# Patient Record
Sex: Female | Born: 1960 | Race: White | Hispanic: No | Marital: Married | Smoking: Never smoker
Health system: Southern US, Community
[De-identification: ages and names within clinical notes are randomized; demographics above are authoritative.]

## PROBLEM LIST (undated history)

## (undated) DIAGNOSIS — L309 Dermatitis, unspecified: Secondary | ICD-10-CM

## (undated) DIAGNOSIS — N840 Polyp of corpus uteri: Secondary | ICD-10-CM

## (undated) DIAGNOSIS — K219 Gastro-esophageal reflux disease without esophagitis: Secondary | ICD-10-CM

## (undated) DIAGNOSIS — N950 Postmenopausal bleeding: Secondary | ICD-10-CM

## (undated) HISTORY — PX: TONSILLECTOMY: SUR1361

---

## 1998-06-03 ENCOUNTER — Other Ambulatory Visit: Admission: RE | Admit: 1998-06-03 | Discharge: 1998-06-03 | Payer: Self-pay | Admitting: Obstetrics and Gynecology

## 1998-11-07 ENCOUNTER — Ambulatory Visit (HOSPITAL_COMMUNITY): Admission: RE | Admit: 1998-11-07 | Discharge: 1998-11-07 | Payer: Self-pay | Admitting: Internal Medicine

## 1998-11-07 ENCOUNTER — Encounter: Payer: Self-pay | Admitting: Internal Medicine

## 1999-07-02 ENCOUNTER — Emergency Department (HOSPITAL_COMMUNITY): Admission: EM | Admit: 1999-07-02 | Discharge: 1999-07-02 | Payer: Self-pay | Admitting: Internal Medicine

## 2000-12-17 ENCOUNTER — Other Ambulatory Visit: Admission: RE | Admit: 2000-12-17 | Discharge: 2000-12-17 | Payer: Self-pay | Admitting: Obstetrics and Gynecology

## 2002-03-13 ENCOUNTER — Other Ambulatory Visit: Admission: RE | Admit: 2002-03-13 | Discharge: 2002-03-13 | Payer: Self-pay | Admitting: Obstetrics and Gynecology

## 2003-05-20 ENCOUNTER — Other Ambulatory Visit: Admission: RE | Admit: 2003-05-20 | Discharge: 2003-05-20 | Payer: Self-pay | Admitting: Obstetrics and Gynecology

## 2004-06-23 ENCOUNTER — Other Ambulatory Visit: Admission: RE | Admit: 2004-06-23 | Discharge: 2004-06-23 | Payer: Self-pay | Admitting: Obstetrics and Gynecology

## 2005-07-16 ENCOUNTER — Other Ambulatory Visit: Admission: RE | Admit: 2005-07-16 | Discharge: 2005-07-16 | Payer: Self-pay | Admitting: Obstetrics and Gynecology

## 2009-09-03 HISTORY — PX: TRANSOBTURATOR SLING: SHX2571

## 2013-04-14 ENCOUNTER — Encounter (HOSPITAL_BASED_OUTPATIENT_CLINIC_OR_DEPARTMENT_OTHER): Payer: Self-pay | Admitting: *Deleted

## 2013-04-14 NOTE — Progress Notes (Signed)
NPO AFTER MN. ARRIVES AT 0700. NEEDS HG. PRE-OP ORDERS PENDING. WILL TAKE PROTONIX AM OF SURG W/ SIP OF WATER.

## 2013-04-16 NOTE — H&P (Signed)
  Patient Cathy [name], Cathy [name] DICTATION#  [id number] CSN# [id number]  Juluis Mire, MD 04/16/2013 1:52 PM

## 2013-04-17 NOTE — H&P (Signed)
NAMEMEGHAN, WARSHAWSKY NO.:  [id number]  MEDICAL RECORD NO.:  [id number]  LOCATION:                                 FACILITY:  PHYSICIAN:  Juluis Mire, M.D.        DATE OF BIRTH:  DATE OF ADMISSION: DATE OF DISCHARGE:                             HISTORY & PHYSICAL   Date of her surgery is April 22, 2013 at [facility] outpatient area.  HISTORY OF PRESENT ILLNESS:  The patient is a 52 year old gravida 1, para 1, postmenopausal patient, presents for hysteroscopy with D and C. The patient was having abnormal postmenopausal bleeding and underwent a saline infusion ultrasound with finding of a polypoid mass, now presents for hysteroscopic resection.  ALLERGIES:  She is allergic to nickel, latex, and formaldehyde.  MEDICATIONS:  She is on pantoprazole 40 mg twice a day, Naprosyn 220 mg as needed, Benadryl 25 mg at bedtime.  PAST MEDICAL HISTORY:  She does have a history of hypertension in the past.  PAST SURGICAL HISTORY:  Also, she has had surgically a vaginal delivery and mid urethral sling and a tonsillectomy.  FAMILY HISTORY:  There is a history of leukemia, hypertension, diabetes.  SOCIAL HISTORY:  No tobacco or alcohol use.  REVIEW OF SYSTEMS:  Noncontributory.  PHYSICAL EXAMINATION:  VITAL SIGNS:  The patient is afebrile, stable vital signs. HEENT:  Patient is normocephalic.  Pupils equal, round, and reactive to light and accommodation.  Extraocular movements were intact.  Sclerae and conjunctivae clear.  Oropharynx was clear. NECK:  Without thyromegaly. BREASTS:  Not examined. LUNGS:  Clear. CARDIAC:  Regular rhythm and rate without murmurs or gallops.  There was no carotid or abdominal bruits. ABDOMEN:  Abdominal exam is benign.  No mass, organomegaly, or tenderness. PELVIC:  Normal external genitalia.  Vaginal cuff is clear.  Cervix unremarkable.  Uterus normal size, shape, and contour.  Adnexa free of mass or  tenderness. EXTREMITIES:  Trace edema. NEUROLOGIC:  Grossly within normal limits.  IMPRESSION:  Abnormal uterine bleeding with endometrial polyp.  PLAN:  The patient is to undergo dilation of cervix with hysteroscopy, resection of polyp, and uterine curettings.  Risks of surgery have been discussed including the risk of infection.  The risk of hemorrhage could require transfusion with the risk of AIDS or hepatitis.  Risk of injury to adjacent organs through perforation that could require exploratory surgery due to injury to organs such as bowel.  Also excessive bleeding could require hysterectomy.  Risk of deep venous thrombosis and pulmonary embolus.  The patient does understand the indications, risks, and potential complications.     Juluis Mire, M.D.     JSM/MEDQ  D:  04/16/2013  T:  04/17/2013  Job:  [id number]

## 2013-04-21 NOTE — Progress Notes (Signed)
Orders in place now from MD.

## 2013-04-22 ENCOUNTER — Encounter (HOSPITAL_BASED_OUTPATIENT_CLINIC_OR_DEPARTMENT_OTHER)
Admission: RE | Disposition: A | Discharge status: Home / Self Care | Payer: Self-pay | Source: Ambulatory Visit | Attending: Obstetrics and Gynecology

## 2013-04-22 ENCOUNTER — Ambulatory Visit (HOSPITAL_BASED_OUTPATIENT_CLINIC_OR_DEPARTMENT_OTHER)
Admission: RE | Admit: 2013-04-22 | Discharge: 2013-04-22 | Disposition: A | Discharge status: Home / Self Care | Payer: Commercial Managed Care - PPO | Source: Ambulatory Visit | Attending: Obstetrics and Gynecology | Admitting: Obstetrics and Gynecology

## 2013-04-22 ENCOUNTER — Encounter (HOSPITAL_BASED_OUTPATIENT_CLINIC_OR_DEPARTMENT_OTHER): Payer: Self-pay | Admitting: *Deleted

## 2013-04-22 ENCOUNTER — Encounter (HOSPITAL_BASED_OUTPATIENT_CLINIC_OR_DEPARTMENT_OTHER): Payer: Self-pay | Admitting: Anesthesiology

## 2013-04-22 ENCOUNTER — Ambulatory Visit: Admit: 2013-04-22 | Payer: Self-pay | Admitting: Obstetrics and Gynecology

## 2013-04-22 ENCOUNTER — Ambulatory Visit (HOSPITAL_BASED_OUTPATIENT_CLINIC_OR_DEPARTMENT_OTHER): Payer: Commercial Managed Care - PPO | Admitting: Anesthesiology

## 2013-04-22 DIAGNOSIS — N840 Polyp of corpus uteri: Secondary | ICD-10-CM | POA: Diagnosis present

## 2013-04-22 HISTORY — PX: DILATATION & CURRETTAGE/HYSTEROSCOPY WITH RESECTOCOPE: SHX5572

## 2013-04-22 HISTORY — DX: Postmenopausal bleeding: N95.0

## 2013-04-22 HISTORY — DX: Dermatitis, unspecified: L30.9

## 2013-04-22 HISTORY — DX: Polyp of corpus uteri: N84.0

## 2013-04-22 HISTORY — DX: Gastro-esophageal reflux disease without esophagitis: K21.9

## 2013-04-22 LAB — CBC
Hemoglobin: 14.6 g/dL (ref 12.0–15.0)
MCH: 30.4 pg (ref 26.0–34.0)
MCHC: 34.3 g/dL (ref 30.0–36.0)
Platelets: 241 10*3/uL (ref 150–400)
RDW: 12.4 % (ref 11.5–15.5)

## 2013-04-22 LAB — HCG, SERUM, QUALITATIVE: Preg, Serum: NEGATIVE

## 2013-04-22 SURGERY — DILATATION & CURETTAGE/HYSTEROSCOPY WITH RESECTOCOPE
Anesthesia: Choice

## 2013-04-22 SURGERY — DILATATION & CURETTAGE/HYSTEROSCOPY WITH RESECTOCOPE
Anesthesia: General | Site: Vagina | Wound class: Clean Contaminated

## 2013-04-22 MED ORDER — LACTATED RINGERS IV SOLN
INTRAVENOUS | Status: DC
  Filled 2013-04-22: qty 1000

## 2013-04-22 MED ORDER — OXYCODONE-ACETAMINOPHEN 7.5-325 MG PO TABS: 1.0000 | ORAL_TABLET | ORAL | Status: AC | PRN

## 2013-04-22 MED ORDER — ONDANSETRON HCL 4 MG/2ML IJ SOLN
INTRAMUSCULAR | Status: DC | PRN
  Administered 2013-04-22: 4 mg via INTRAVENOUS

## 2013-04-22 MED ORDER — LIDOCAINE-EPINEPHRINE 1 %-1:100000 IJ SOLN
INTRAMUSCULAR | Status: DC | PRN
  Administered 2013-04-22: 18 mL

## 2013-04-22 MED ORDER — KETOROLAC TROMETHAMINE 30 MG/ML IJ SOLN
INTRAMUSCULAR | Status: DC | PRN
  Administered 2013-04-22: 30 mg via INTRAVENOUS

## 2013-04-22 MED ORDER — LACTATED RINGERS IV SOLN
INTRAVENOUS | Status: DC | PRN
  Administered 2013-04-22 (×2): via INTRAVENOUS

## 2013-04-22 MED ORDER — DEXAMETHASONE SODIUM PHOSPHATE 4 MG/ML IJ SOLN
INTRAMUSCULAR | Status: DC | PRN
  Administered 2013-04-22: 10 mg via INTRAVENOUS

## 2013-04-22 MED ORDER — GLYCINE 1.5 % IR SOLN
Status: DC | PRN
  Administered 2013-04-22: 3000 mL

## 2013-04-22 MED ORDER — CEFAZOLIN SODIUM-DEXTROSE 2-3 GM-% IV SOLR
2.0000 g | INTRAVENOUS | Status: AC
  Administered 2013-04-22: 2 g via INTRAVENOUS
  Filled 2013-04-22: qty 50

## 2013-04-22 MED ORDER — FENTANYL CITRATE 0.05 MG/ML IJ SOLN
INTRAMUSCULAR | Status: DC | PRN
  Administered 2013-04-22: 50 ug via INTRAVENOUS
  Administered 2013-04-22 (×2): 25 ug via INTRAVENOUS

## 2013-04-22 MED ORDER — PROMETHAZINE HCL 25 MG/ML IJ SOLN
6.2500 mg | INTRAMUSCULAR | Status: DC | PRN
  Filled 2013-04-22: qty 1

## 2013-04-22 MED ORDER — MIDAZOLAM HCL 5 MG/5ML IJ SOLN
INTRAMUSCULAR | Status: DC | PRN
  Administered 2013-04-22 (×2): 2 mg via INTRAVENOUS

## 2013-04-22 MED ORDER — LACTATED RINGERS IV SOLN
INTRAVENOUS | Status: DC
  Administered 2013-04-22: 07:00:00 via INTRAVENOUS
  Filled 2013-04-22: qty 1000

## 2013-04-22 MED ORDER — PROPOFOL 10 MG/ML IV BOLUS
INTRAVENOUS | Status: DC | PRN
  Administered 2013-04-22: 200 mg via INTRAVENOUS

## 2013-04-22 MED ORDER — LIDOCAINE HCL (CARDIAC) 20 MG/ML IV SOLN
INTRAVENOUS | Status: DC | PRN
  Administered 2013-04-22: 100 mg via INTRAVENOUS

## 2013-04-22 MED ORDER — HYDROMORPHONE HCL PF 1 MG/ML IJ SOLN
0.2500 mg | INTRAMUSCULAR | Status: DC | PRN
  Filled 2013-04-22: qty 1

## 2013-04-22 SURGICAL SUPPLY — 31 items
CANISTER SUCTION 2500CC (MISCELLANEOUS) ×2 IMPLANT
CATH ROBINSON RED A/P 16FR (CATHETERS) IMPLANT
CLOTH BEACON ORANGE TIMEOUT ST (SAFETY) ×2 IMPLANT
CORD ACTIVE DISPOSABLE (ELECTRODE) ×1
CORD ELECTRO ACTIVE DISP (ELECTRODE) IMPLANT
COVER TABLE BACK 60X90 (DRAPES) ×2 IMPLANT
DRAPE CAMERA CLOSED 9X96 (DRAPES) ×2 IMPLANT
DRAPE LG THREE QUARTER DISP (DRAPES) ×2 IMPLANT
DRESSING TELFA 8X3 (GAUZE/BANDAGES/DRESSINGS) ×2 IMPLANT
ELECT LOOP GYNE PRO 24FR (CUTTING LOOP) ×2
ELECT REM PT RETURN 9FT ADLT (ELECTROSURGICAL) ×2
ELECT VAPORTRODE GRVD BAR (ELECTRODE) IMPLANT
ELECTRODE LOOP GYNE PRO 24FR (CUTTING LOOP) ×1 IMPLANT
ELECTRODE REM PT RTRN 9FT ADLT (ELECTROSURGICAL) ×1 IMPLANT
GLOVE BIO SURGEON STRL SZ7 (GLOVE) ×4 IMPLANT
GLOVE BIOGEL PI IND STRL 6.5 (GLOVE) IMPLANT
GLOVE BIOGEL PI IND STRL 7.5 (GLOVE) IMPLANT
GLOVE BIOGEL PI INDICATOR 6.5 (GLOVE) ×1
GLOVE BIOGEL PI INDICATOR 7.5 (GLOVE) ×2
GLOVE SKINSENSE STRL SZ6.0 (GLOVE) ×1 IMPLANT
GOWN PREVENTION PLUS LG XLONG (DISPOSABLE) ×5 IMPLANT
LEGGING LITHOTOMY PAIR STRL (DRAPES) ×2 IMPLANT
NDL SPNL 22GX3.5 QUINCKE BK (NEEDLE) ×1 IMPLANT
NEEDLE SPNL 22GX3.5 QUINCKE BK (NEEDLE) ×2 IMPLANT
PACK BASIN DAY SURGERY FS (CUSTOM PROCEDURE TRAY) ×2 IMPLANT
PAD OB MATERNITY 4.3X12.25 (PERSONAL CARE ITEMS) ×2 IMPLANT
PAD PREP 24X48 CUFFED NSTRL (MISCELLANEOUS) ×2 IMPLANT
SYR CONTROL 10ML LL (SYRINGE) ×2 IMPLANT
TOWEL OR 17X24 6PK STRL BLUE (TOWEL DISPOSABLE) ×4 IMPLANT
TUBING HYDROFLEX HYSTEROSCOPY (TUBING) ×2 IMPLANT
WATER STERILE IRR 500ML POUR (IV SOLUTION) ×2 IMPLANT

## 2013-04-22 NOTE — Op Note (Signed)
Patient name  Cathy [name], Cathy [name] DICTATION#  [id number] CSN# [id number]  [facility], MD 04/22/2013 9:01 AM

## 2013-04-22 NOTE — Anesthesia Preprocedure Evaluation (Signed)
Anesthesia Evaluation  Patient identified by MRN, date of birth, ID band Patient awake    Reviewed: Allergy & Precautions, H&P , NPO status , Patient's Chart, lab work & pertinent test results  Airway Mallampati: II TM Distance: >3 FB Neck ROM: Full    Dental  (+) Teeth Intact, Dental Advisory Given and Caps   Pulmonary neg pulmonary ROS,  breath sounds clear to auscultation  Pulmonary exam normal       Cardiovascular negative cardio ROS  Rhythm:Regular Rate:Normal     Neuro/Psych negative neurological ROS  negative psych ROS   GI/Hepatic negative GI ROS, Neg liver ROS, GERD-  ,  Endo/Other  negative endocrine ROS  Renal/GU negative Renal ROS  negative genitourinary   Musculoskeletal negative musculoskeletal ROS (+)   Abdominal   Peds  Hematology negative hematology ROS (+)   Anesthesia Other Findings   Reproductive/Obstetrics                           Anesthesia Physical Anesthesia Plan  ASA: I  Anesthesia Plan: General   Post-op Pain Management:    Induction: Intravenous  Airway Management Planned: LMA  Additional Equipment:   Intra-op Plan:   Post-operative Plan: Extubation in OR  Informed Consent: I have reviewed the patients History and Physical, chart, labs and discussed the procedure including the risks, benefits and alternatives for the proposed anesthesia with the patient or authorized representative who has indicated his/her understanding and acceptance.   Dental advisory given  Plan Discussed with: CRNA  Anesthesia Plan Comments:         Anesthesia Quick Evaluation

## 2013-04-22 NOTE — Anesthesia Postprocedure Evaluation (Signed)
Anesthesia Post Note  Patient: Cathy [name]  Procedure(s) Performed: Procedure(s) (LRB): DILATATION & CURETTAGE/HYSTEROSCOPY WITH RESECTOCOPE (N/A)  Anesthesia type: General  Patient location: PACU  Post pain: Pain level controlled  Post assessment: Post-op Vital signs reviewed  Last Vitals:  Filed Vitals:   04/22/13 0930  BP: 114/54  Pulse: 78  Temp:   Resp: 14    Post vital signs: Reviewed  Level of consciousness: sedated  Complications: No apparent anesthesia complications

## 2013-04-22 NOTE — Transfer of Care (Signed)
Immediate Anesthesia Transfer of Care Note  Patient: Cathy [name]  Procedure(s) Performed: Procedure(s) (LRB): DILATATION & CURETTAGE/HYSTEROSCOPY WITH RESECTOCOPE (N/A)  Patient Location: PACU  Anesthesia Type: General  Level of Consciousness: awake, sedated, patient cooperative and responds to stimulation  Airway & Oxygen Therapy: Patient Spontanous Breathing and Patient connected to face mask oxygen  Post-op Assessment: Report given to PACU RN, Post -op Vital signs reviewed and stable and Patient moving all extremities  Post vital signs: Reviewed and stable  Complications: No apparent anesthesia complications

## 2013-04-22 NOTE — Brief Op Note (Signed)
04/22/2013  8:59 AM  PATIENT:  Cathy [name]  52 y.o. female  PRE-OPERATIVE DIAGNOSIS:  polyp  POST-OPERATIVE DIAGNOSIS:  polyp  PROCEDURE:  Procedure(s): DILATATION & CURETTAGE/HYSTEROSCOPY WITH RESECTOCOPE (N/A)  SURGEON:  Surgeon(s) and Role:    * Juluis Mire, MD - Primary  PHYSICIAN ASSISTANT:   ASSISTANTS: none   ANESTHESIA:   general and paracervical block  EBL:  Total I/O In: 100 [I.V.:100] Out: -   BLOOD ADMINISTERED:none  DRAINS: none   LOCAL MEDICATIONS USED:  XYLOCAINE   SPECIMEN:  Source of Specimen:  endometrial resections and curettings  DISPOSITION OF SPECIMEN:  PATHOLOGY  COUNTS:  YES  TOURNIQUET:  * No tourniquets in log *  DICTATION: .Other Dictation: Dictation Number [id number]  PLAN OF CARE: Discharge to home after PACU  PATIENT DISPOSITION:  PACU - hemodynamically stable.   Delay start of Pharmacological VTE agent (>24hrs) due to surgical blood loss or risk of bleeding: not applicable

## 2013-04-22 NOTE — Anesthesia Procedure Notes (Signed)
Procedure Name: LMA Insertion Date/Time: 04/22/2013 8:36 AM Performed by: Jessica Priest Pre-anesthesia Checklist: Patient identified, Emergency Drugs available, Suction available and Patient being monitored Patient Re-evaluated:Patient Re-evaluated prior to inductionOxygen Delivery Method: Circle System Utilized Preoxygenation: Pre-oxygenation with 100% oxygen Intubation Type: IV induction Ventilation: Mask ventilation without difficulty LMA: LMA inserted LMA Size: 4.0 Number of attempts: 1 Airway Equipment and Method: bite block Placement Confirmation: positive ETCO2 Tube secured with: Tape Dental Injury: Teeth and Oropharynx as per pre-operative assessment

## 2013-04-22 NOTE — H&P (Signed)
  History and physical exam unchanged 

## 2013-04-23 ENCOUNTER — Encounter (HOSPITAL_BASED_OUTPATIENT_CLINIC_OR_DEPARTMENT_OTHER): Payer: Self-pay | Admitting: Obstetrics and Gynecology

## 2013-04-23 NOTE — Op Note (Signed)
Cathy, [name] NO.:  [id number]  MEDICAL RECORD NO.:  [id number]  LOCATION:                                 FACILITY:  PHYSICIAN:  Juluis Mire, M.D.        DATE OF BIRTH:  DATE OF PROCEDURE:  04/22/2013 DATE OF DISCHARGE:                              OPERATIVE REPORT   PREOPERATIVE DIAGNOSIS:  Endometrial polyp.  POSTOPERATIVE DIAGNOSIS:  Endometrial polyp.  OPERATIVE PROCEDURE:  Paracervical block.  Cervical dilation with hysteroscopy with endometrial resections and curettings.  SURGEON:  Juluis Mire, M.D.  ANESTHESIA:  General plus paracervical block.  BLOOD LOSS:  Minimal.  PACKS AND DRAINS:  None.  INTRAOPERATIVE BLOOD PLACED:  None.  COMPLICATIONS:  None.  INDICATIONS:  Dictated in history and physical.  PROCEDURE:  As follows.  The patient was taken to the OR and placed in a supine position.  After satisfactory level of general anesthesia was obtained, the patient was placed in dorsal lithotomy position using Allen stirrups.  The perineum and vagina were prepped out with Betadine and draped in sterile field.  A speculum was placed in the vaginal vault.  The cervix was grasped with single-tooth tenaculum.  A paracervical block, 1% Xylocaine with epinephrine was instituted. Uterus sounded to 9 cm.  Cervix was serially dilated to a size 35 Pratt dilator.  Operative hysteroscope was introduced.  Intrauterine cavity was distended using glycine.  You could see the remnants of the polyp anteriorly, it had been somewhat disrupted by the dilation, I resected that and did other endometrial resections that were sent to Pathology. Deficit was minimal.  At this point in time, the hysteroscope was removed. Endometrial curettings were obtained and sent for Pathology.  Single- tooth tenaculum was inspected and removed.  The patient was taken out of the dorsal lithotomy position.  Once alert and extubated, transferred to recovery room in good  condition.  Sponge, instrument, and needle count was correct by circulating nurse x2.     Juluis Mire, M.D.     JSM/MEDQ  D:  04/22/2013  T:  04/22/2013  Job:  [id number]

## 2013-07-09 ENCOUNTER — Other Ambulatory Visit: Payer: Self-pay

## 2016-10-04 HISTORY — PX: ROTATOR CUFF REPAIR: SHX139

## 2018-01-19 ENCOUNTER — Other Ambulatory Visit: Payer: Self-pay

## 2018-01-19 ENCOUNTER — Emergency Department (HOSPITAL_BASED_OUTPATIENT_CLINIC_OR_DEPARTMENT_OTHER): Payer: 59

## 2018-01-19 ENCOUNTER — Encounter (HOSPITAL_BASED_OUTPATIENT_CLINIC_OR_DEPARTMENT_OTHER): Payer: Self-pay | Admitting: Emergency Medicine

## 2018-01-19 ENCOUNTER — Emergency Department (HOSPITAL_BASED_OUTPATIENT_CLINIC_OR_DEPARTMENT_OTHER)
Admission: EM | Admit: 2018-01-19 | Discharge: 2018-01-19 | Disposition: A | Discharge status: Home / Self Care | Payer: 59 | Attending: Emergency Medicine | Admitting: Emergency Medicine

## 2018-01-19 DIAGNOSIS — S63502A Unspecified sprain of left wrist, initial encounter: Secondary | ICD-10-CM

## 2018-01-19 DIAGNOSIS — Y93E5 Activity, floor mopping and cleaning: Secondary | ICD-10-CM | POA: Insufficient documentation

## 2018-01-19 DIAGNOSIS — Y999 Unspecified external cause status: Secondary | ICD-10-CM | POA: Diagnosis not present

## 2018-01-19 DIAGNOSIS — W010XXA Fall on same level from slipping, tripping and stumbling without subsequent striking against object, initial encounter: Secondary | ICD-10-CM | POA: Diagnosis not present

## 2018-01-19 DIAGNOSIS — Z79899 Other long term (current) drug therapy: Secondary | ICD-10-CM | POA: Insufficient documentation

## 2018-01-19 DIAGNOSIS — Z91040 Latex allergy status: Secondary | ICD-10-CM | POA: Diagnosis not present

## 2018-01-19 DIAGNOSIS — M545 Low back pain: Secondary | ICD-10-CM | POA: Diagnosis not present

## 2018-01-19 DIAGNOSIS — Y929 Unspecified place or not applicable: Secondary | ICD-10-CM | POA: Diagnosis not present

## 2018-01-19 DIAGNOSIS — S6992XA Unspecified injury of left wrist, hand and finger(s), initial encounter: Secondary | ICD-10-CM | POA: Diagnosis present

## 2018-01-19 NOTE — ED Provider Notes (Signed)
[facility] EMERGENCY DEPARTMENT Provider Note   CSN: [id number] Arrival date & time: 01/19/18  1850     History   Chief Complaint Chief Complaint  Patient presents with  . Fall  . Wrist Injury  . Back Pain  . Neck Pain    HPI Cathy [name] is a 57 y.o. female.  HPI  57 year old female presents after a slip and fall.  She was cleaning the floor and slipped on the wet floor and landed on her buttocks and left arm.  She does not think her arm was outstretched.  She did not hit her head or lose consciousness.  She has some mild pain to her buttocks but states she thinks that is more bruising.  She is been having moderate, 5/10 pain to her left wrist.  Mostly to the dorsal aspect.  No hand pain.  She denies any significant swelling.  No weakness or numbness.  Past Medical History:  Diagnosis Date  . Eczema   . Endometrial polyp   . GERD (gastroesophageal reflux disease)   . Post-menopausal bleeding     Patient Active Problem List   Diagnosis Date Noted  . Endometrial polyp 04/22/2013    Class: Present on Admission    Past Surgical History:  Procedure Laterality Date  . DILATATION & CURRETTAGE/HYSTEROSCOPY WITH RESECTOCOPE N/A 04/22/2013   Procedure: DILATATION & CURETTAGE/HYSTEROSCOPY WITH RESECTOCOPE;  Surgeon: Juluis Mire, MD;  Location: [facility];  Service: Gynecology;  Laterality: N/A;  . ROTATOR CUFF REPAIR Left 10/2016  . TONSILLECTOMY  AS CHILD  . TRANSOBTURATOR SLING  2011     OB History   None      Home Medications    Prior to Admission medications   Medication Sig Start Date End Date Taking? Authorizing Provider  Calcium Carbonate-Vitamin D (CALCIUM 600 + D PO) Take 2 tablets by mouth daily.    [provider]  Ginkgo Biloba 40 MG TABS Take 1 tablet by mouth daily.    [provider]  Misc Natural Products (TOTAL MEMORY & FOCUS FORMULA PO) Take 1 tablet by mouth daily.    [provider]    Multiple Vitamin (MULTIVITAMIN) tablet Take 1 tablet by mouth daily.    [provider]  oxyCODONE-acetaminophen (PERCOCET) 7.5-325 MG per tablet Take 1 tablet by mouth every 4 (four) hours as needed for pain. 04/22/13   Richardean Chimera, MD  pantoprazole (PROTONIX) 40 MG tablet Take 40 mg by mouth daily. AND PM PRN    [provider]    Family History No family history on file.  Social History Social History   Tobacco Use  . Smoking status: Never Smoker  . Smokeless tobacco: Never Used  Substance Use Topics  . Alcohol use: No  . Drug use: No     Allergies   Latex   Review of Systems Review of Systems  Musculoskeletal: Positive for arthralgias. Negative for joint swelling.  Neurological: Negative for syncope, weakness, numbness and headaches.  All other systems reviewed and are negative.    Physical Exam Updated Vital Signs BP (!) 145/90 (BP Location: Right Arm)   Pulse 71   Temp 98.2 F (36.8 C) (Oral)   Resp 16   Ht 5' 5.5" (1.664 m)   Wt 62.6 kg (138 lb)   SpO2 100%   BMI 22.62 kg/m   Physical Exam  Constitutional: She is oriented to person, place, and time. She appears well-developed and well-nourished. No distress.  HENT:  Head: Normocephalic and atraumatic.  Right Ear: External ear normal.  Left Ear: External ear normal.  Nose: Nose normal.  Eyes: Right eye exhibits no discharge. Left eye exhibits no discharge.  Cardiovascular: Normal rate and regular rhythm.  Pulses:      Radial pulses are 2+ on the left side.  Pulmonary/Chest: Effort normal.  Abdominal: She exhibits no distension.  Musculoskeletal:       Left wrist: She exhibits tenderness. She exhibits normal range of motion and no swelling.       Lumbar back: She exhibits tenderness (mild, over coccyx).       Back:       Left forearm: She exhibits no tenderness.       Left hand: She exhibits normal range of motion, no tenderness (specifically no scaphoid tenderness) and no  deformity.  Normal median, ulnar, radial nerve testing  Neurological: She is alert and oriented to person, place, and time.  Skin: Skin is warm and dry. She is not diaphoretic.  Nursing note and vitals reviewed.    ED Treatments / Results  Labs (all labs ordered are listed, but only abnormal results are displayed) Labs Reviewed - No data to display  EKG None  Radiology Dg Wrist Complete Left  Result Date: 01/19/2018 CLINICAL DATA:  Larey Seat while cleaning today. EXAM: LEFT WRIST - COMPLETE 3+ VIEW COMPARISON:  None available for comparison at time of study interpretation. FINDINGS: Small bony fragment within the dorsum of the wrist seen on lateral radiograph. No specific donor site. No dislocation. No destructive bony lesions. Mild dorsal wrist soft tissue swelling without subcutaneous gas or radiopaque foreign bodies. IMPRESSION: Triquetrum fracture versus projectional artifact, recommend correlation with point tenderness. Electronically Signed   By: Awilda Metro M.D.   On: 01/19/2018 19:46    Procedures Procedures (including critical care time)  Medications Ordered in ED Medications - No data to display   Initial Impression / Assessment and Plan / ED Course  I have reviewed the triage vital signs and the nursing notes.  Pertinent labs & imaging results that were available during my care of the patient were reviewed by me and considered in my medical decision making (see chart for details).     Patient declines xray of tailbone/coccyx. Ambulating normally. No head injury. Left wrist is sore at the proximal aspect, no acute wrist fracture. The triquetrum is not tender on reevaluation and so I think this is likely artifact. Will place in wrist splint for comfort, f/u with PCP. RICE. Return precautions.   Final Clinical Impressions(s) / ED Diagnoses   Final diagnoses:  Sprain of left wrist, initial encounter    ED Discharge Orders    None       Pricilla Loveless,  MD 01/19/18 2344

## 2018-01-19 NOTE — ED Triage Notes (Signed)
Pt c/o slip and fell while cleaning the floor. Pt c/o pain to left wrist, tailbone pain that radiated up to neck. Pt denies + LOC.

## 2018-01-19 NOTE — Discharge Instructions (Signed)
If you develop worsening pain, swelling, or any other new/concerning symptoms return to the ER or see your doctor.  You may wear the wrist splint for comfort.  Use ibuprofen and/or Tylenol as well as ice for the pain.  If you develop pain at the base of your hand, especially on the pinky side, this could indicate a small fracture and you need to follow-up with your doctor for repeat x-rays.

## 2018-01-19 NOTE — ED Notes (Signed)
ED Provider at bedside. 

## 2019-02-16 IMAGING — CR DG WRIST COMPLETE 3+V*L*
4 series · 4 of 4 positions shown · non-contrast
Comparison: None available for comparison at time of study
interpretation.

CLINICAL DATA: Fell while cleaning today.

EXAM:
LEFT WRIST - COMPLETE 3+ VIEW

[x wrist pa left]
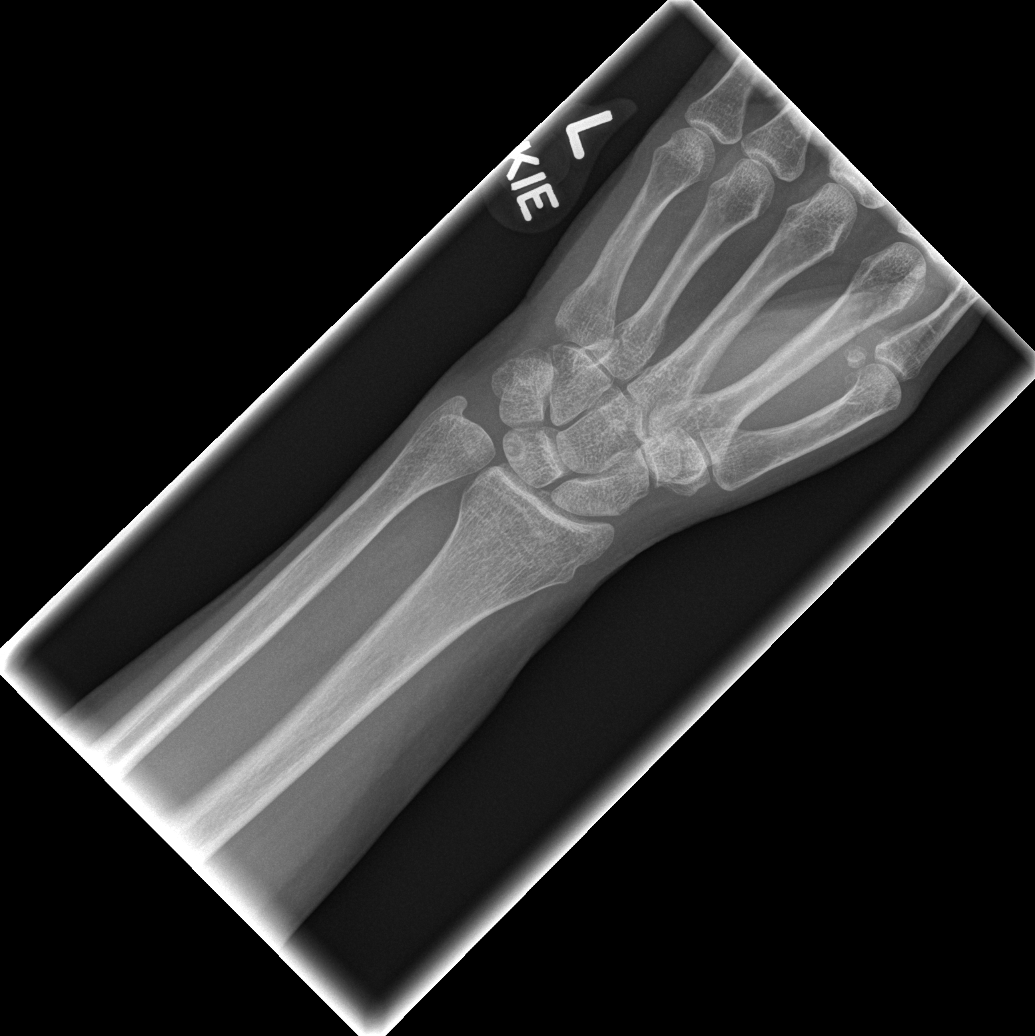

[x wrist obl left]
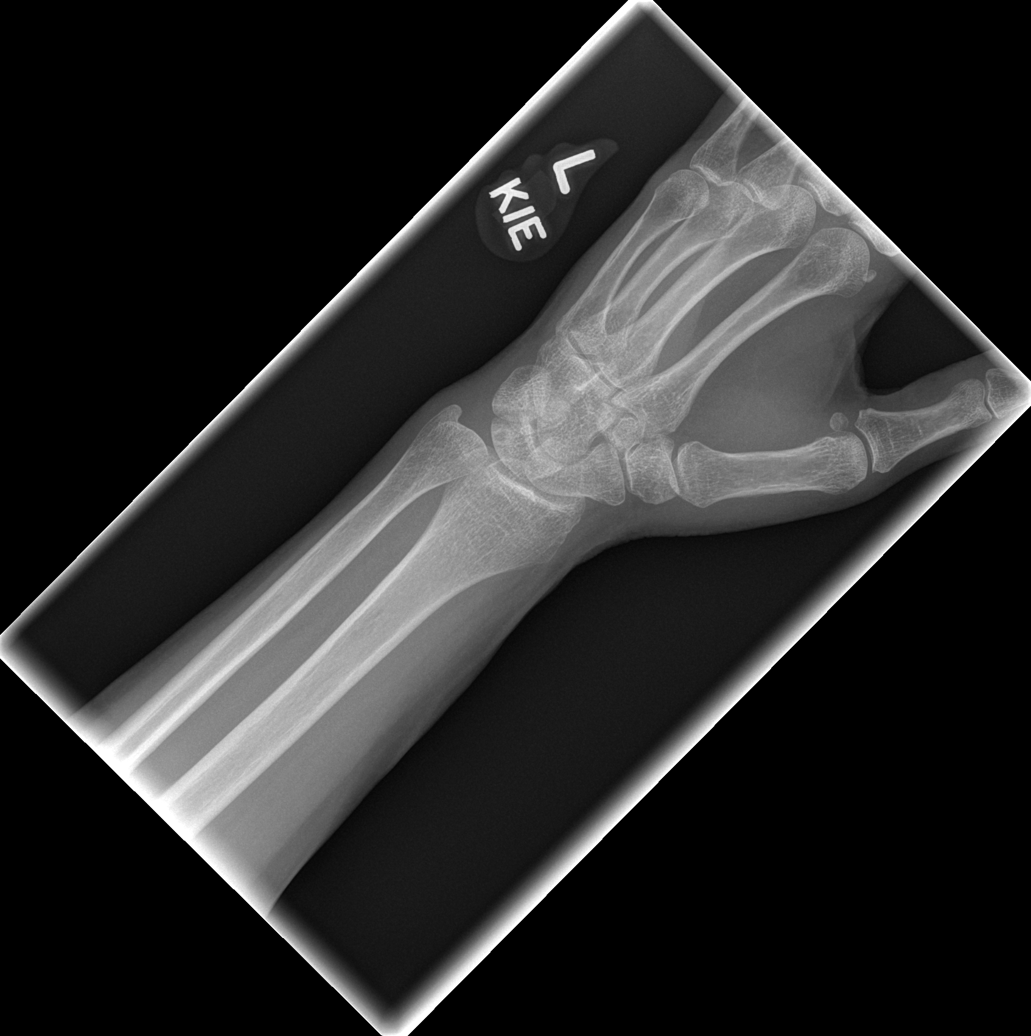

[x wrist lat left]
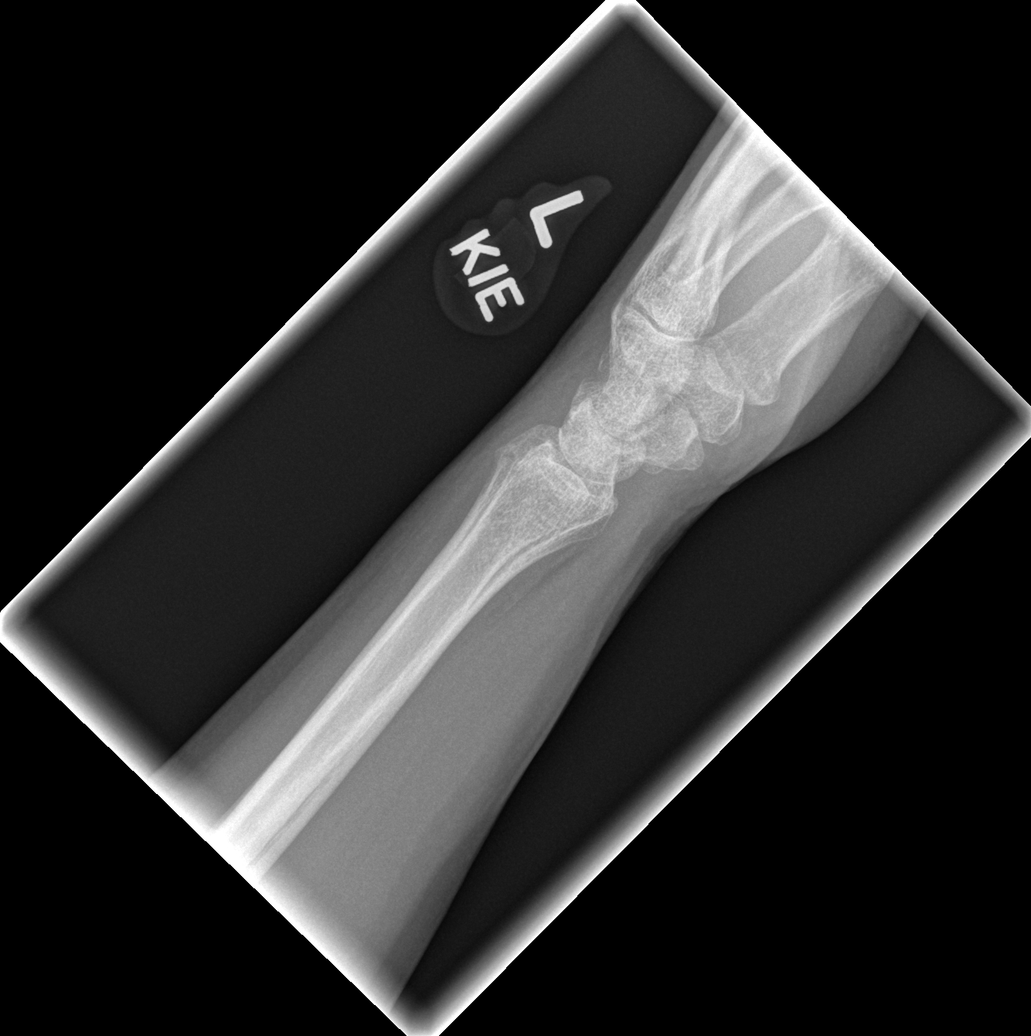

[x navicular]
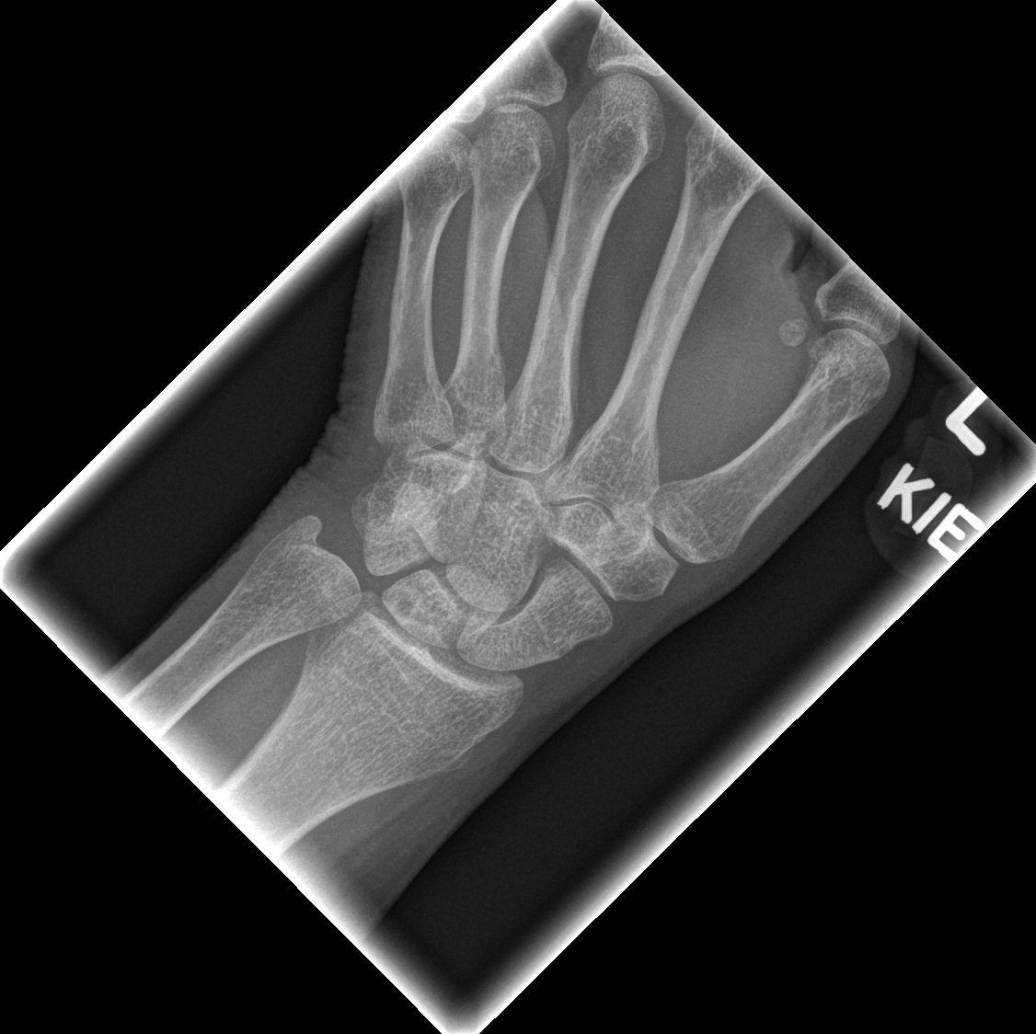

[4 of 4 positions shown; findings below may reference images not displayed]

FINDINGS: Small bony fragment within the dorsum of the wrist seen on lateral
radiograph. No specific donor site. No dislocation. No destructive
bony lesions. Mild dorsal wrist soft tissue swelling without
subcutaneous gas or radiopaque foreign bodies.
IMPRESSION: Triquetrum fracture versus projectional artifact, recommend
correlation with point tenderness.
